# Patient Record
Sex: Female | Born: 1950 | Race: Black or African American | Hispanic: No
Health system: Southern US, Community
[De-identification: ages and names within clinical notes are randomized; demographics above are authoritative.]

---

## 2010-03-04 ENCOUNTER — Ambulatory Visit: Payer: Self-pay

## 2010-06-11 ENCOUNTER — Emergency Department: Payer: Self-pay | Admitting: Emergency Medicine

## 2010-11-19 ENCOUNTER — Emergency Department: Payer: Self-pay | Admitting: Emergency Medicine

## 2010-11-25 ENCOUNTER — Inpatient Hospital Stay: Payer: Self-pay | Admitting: Surgery

## 2011-01-17 ENCOUNTER — Ambulatory Visit: Payer: Self-pay | Admitting: Gastroenterology

## 2011-01-21 LAB — PATHOLOGY REPORT

## 2011-01-23 ENCOUNTER — Ambulatory Visit: Payer: Self-pay | Admitting: Gastroenterology

## 2012-02-10 ENCOUNTER — Ambulatory Visit: Payer: Self-pay | Admitting: Family

## 2012-02-20 ENCOUNTER — Ambulatory Visit: Payer: Self-pay | Admitting: Emergency Medicine

## 2012-02-24 LAB — PATHOLOGY REPORT

## 2013-04-21 IMAGING — CR DG CHEST 2V
1 series · 2 of 2 positions shown · non-contrast
Comparison: none

REASON FOR EXAM: Wheezing, SOB, Chronic Smoker
COMMENTS:

[Series 1: pa · 0.17mm/px · 2 of 2 slices shown]
[im 1/2]
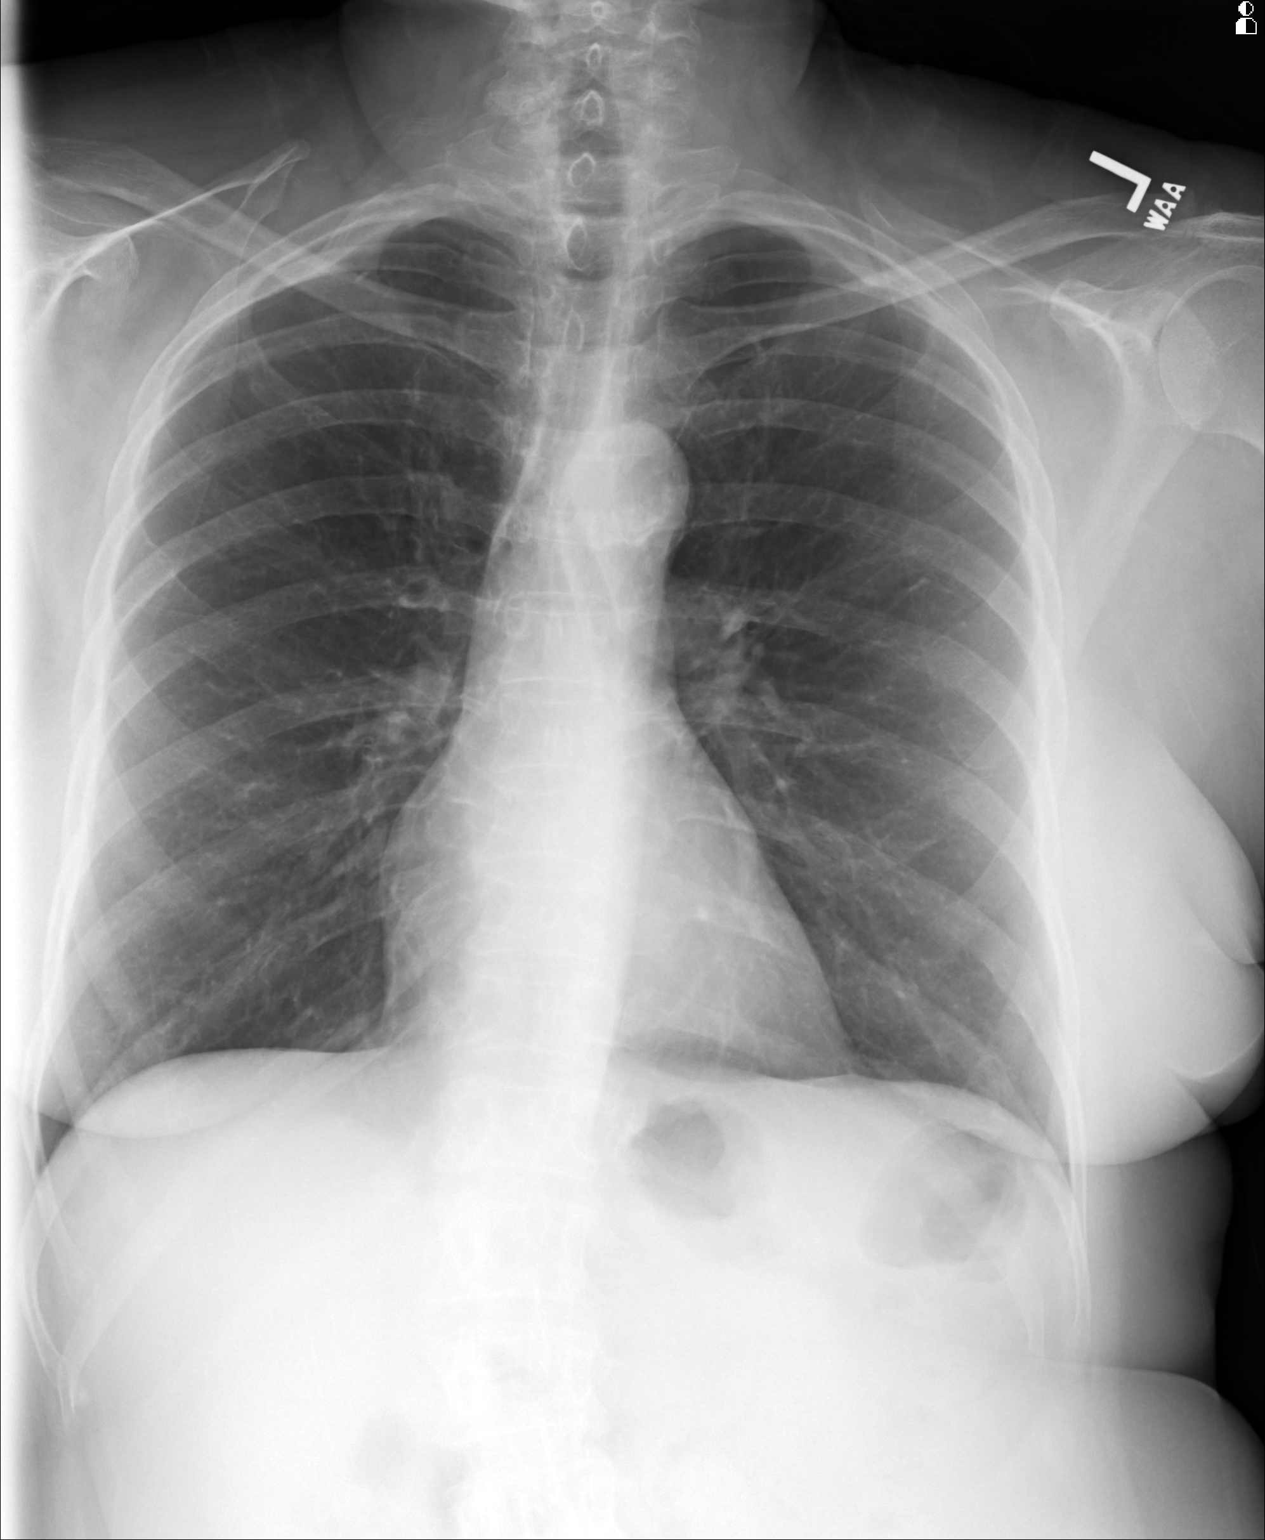
[im 2/2]
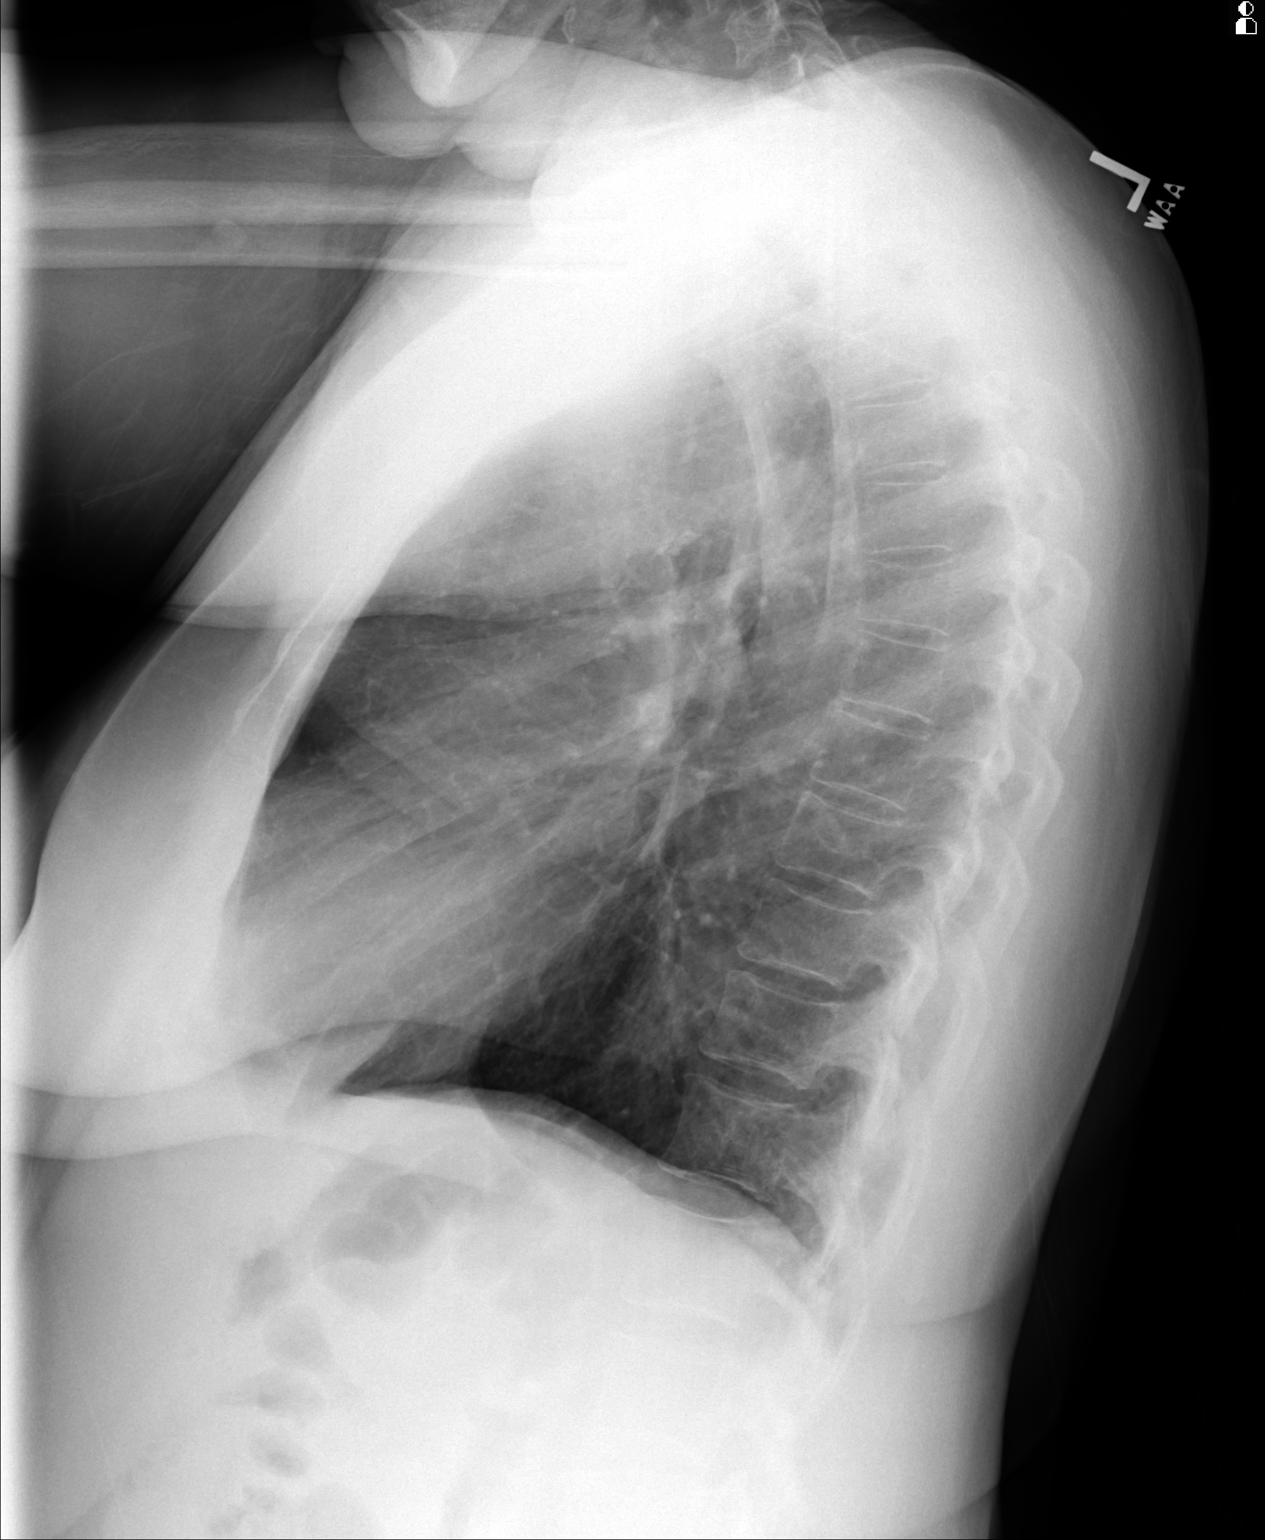

[2 of 2 positions shown; findings below may reference images not displayed]

PROCEDURE:     DXR - DXR CHEST PA (OR AP) AND LATERAL  - February 10, 2012 [DATE]

RESULT:     Comparison is made to study November 26, 2010.

The lungs are mildly hyperinflated but clear. The cardiac silhouette is
normal in size. The pulmonary vascularity is not engorged. The mediastinum
is normal in width. There is no pleural effusion.
IMPRESSION: There is hyperinflation consistent with COPD. There is no
evidence of CHF nor of pneumonia.

## 2014-12-03 NOTE — Op Note (Signed)
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   POSTOPERATIVE DIAGNOSIS:large skin tumor  PROCEDURe excsion of large skin tumor with adjacent tissue transfer  SURGEON: Jovita GammaMasud Caysie Minnifield, MD  DESCRIPTION OF PROCEDURE: The patient was then brought to surgery and under general anesthesia the lesion was then completely excised. After that I was not able to close it so I undermined the edges a lot from each side, marked the specimen, and then closed the skin with layer closure with 3-0 Vicryl interrupted below and then 4-0 Prolene interrupted sutures on the skin.  Steri-Strips were applied. The patient tolerated the procedure well and was sent to the recovery room in satisfactory condition.  ____________________________ Toni RevereMasud S. Cecelia ByarsHashmi, MD [id number] D: 02/20/2012 10:08:01 ET T: 02/20/2012 10:18:40 ET JOB#: [id number]  cc: Jolinda Pinkstaff S. Cecelia ByarsHashmi, MD, <Dictator> Toni ReadyMASUD S Rabab Currington MD ELECTRONICALLY SIGNED 02/23/2012 21:56
